# Patient Record
Sex: Female | Born: 1955 | Hispanic: Yes | Marital: Married
Health system: Southern US, Community
[De-identification: ages and names within clinical notes are randomized; demographics above are authoritative.]

---

## 2012-01-07 ENCOUNTER — Inpatient Hospital Stay: Payer: Self-pay | Admitting: Internal Medicine

## 2012-01-07 LAB — HEPATIC FUNCTION PANEL A (ARMC)
Albumin: 3.1 g/dL — ABNORMAL LOW (ref 3.4–5.0)
Alkaline Phosphatase: 240 U/L — ABNORMAL HIGH (ref 50–136)
Bilirubin, Direct: 0.2 mg/dL (ref 0.00–0.20)
SGOT(AST): 46 U/L — ABNORMAL HIGH (ref 15–37)
SGPT (ALT): 63 U/L
Total Protein: 7.4 g/dL (ref 6.4–8.2)

## 2012-01-07 LAB — BASIC METABOLIC PANEL
BUN: 52 mg/dL — ABNORMAL HIGH (ref 7–18)
Calcium, Total: 8.2 mg/dL — ABNORMAL LOW (ref 8.5–10.1)
Chloride: 98 mmol/L (ref 98–107)
Co2: 28 mmol/L (ref 21–32)
Creatinine: 7.95 mg/dL — ABNORMAL HIGH (ref 0.60–1.30)
EGFR (African American): 6 — ABNORMAL LOW
Glucose: 193 mg/dL — ABNORMAL HIGH (ref 65–99)
Potassium: 3.9 mmol/L (ref 3.5–5.1)

## 2012-01-07 LAB — CBC WITH DIFFERENTIAL/PLATELET
Basophil #: 0 10*3/uL (ref 0.0–0.1)
Eosinophil %: 0.1 %
HCT: 33 % — ABNORMAL LOW (ref 35.0–47.0)
Lymphocyte %: 7 %
MCV: 107 fL — ABNORMAL HIGH (ref 80–100)
Monocyte #: 1 x10 3/mm — ABNORMAL HIGH (ref 0.2–0.9)
Neutrophil #: 8.8 10*3/uL — ABNORMAL HIGH (ref 1.4–6.5)
Neutrophil %: 83.4 %
WBC: 10.5 10*3/uL (ref 3.6–11.0)

## 2012-01-07 LAB — CK TOTAL AND CKMB (NOT AT ARMC)
CK-MB: <0.5 ng/mL — ABNORMAL LOW (ref 0.5–3.6)
CK-MB: <0.5 ng/mL — ABNORMAL LOW (ref 0.5–3.6)

## 2012-01-07 LAB — TROPONIN I
Troponin-I: <0.02 ng/mL
Troponin-I: <0.02 ng/mL

## 2012-01-08 LAB — URINALYSIS, COMPLETE
Bilirubin,UR: NEGATIVE
Glucose,UR: NEGATIVE mg/dL (ref 0–75)
Protein: >=500
Specific Gravity: 1.053 (ref 1.003–1.030)
Squamous Epithelial: 51

## 2012-01-08 LAB — CBC WITH DIFFERENTIAL/PLATELET
Basophil #: 0 10*3/uL (ref 0.0–0.1)
Eosinophil %: 0.3 %
HGB: 10 g/dL — ABNORMAL LOW (ref 12.0–16.0)
MCV: 107 fL — ABNORMAL HIGH (ref 80–100)
Monocyte #: 1.1 x10 3/mm — ABNORMAL HIGH (ref 0.2–0.9)
Monocyte %: 11.2 %
Platelet: 136 10*3/uL — ABNORMAL LOW (ref 150–440)
RDW: 14.4 % (ref 11.5–14.5)
WBC: 9.6 10*3/uL (ref 3.6–11.0)

## 2012-01-08 LAB — BASIC METABOLIC PANEL
Anion Gap: 14 (ref 7–16)
BUN: 60 mg/dL — ABNORMAL HIGH (ref 7–18)
Calcium, Total: 7.9 mg/dL — ABNORMAL LOW (ref 8.5–10.1)
Chloride: 98 mmol/L (ref 98–107)
Creatinine: 8.92 mg/dL — ABNORMAL HIGH (ref 0.60–1.30)
EGFR (African American): 5 — ABNORMAL LOW
Osmolality: 293 (ref 275–301)
Potassium: 4.1 mmol/L (ref 3.5–5.1)

## 2012-01-08 LAB — LIPID PANEL
HDL Cholesterol: 33 mg/dL — ABNORMAL LOW (ref 40–60)
Ldl Cholesterol, Calc: 7 mg/dL (ref 0–100)
Triglycerides: 196 mg/dL (ref 0–200)

## 2012-01-08 LAB — MAGNESIUM: Magnesium: 3.2 mg/dL — ABNORMAL HIGH

## 2012-01-08 LAB — PHOSPHORUS: Phosphorus: 4 mg/dL (ref 2.5–4.9)

## 2012-01-08 LAB — CK TOTAL AND CKMB (NOT AT ARMC): CK, Total: 195 U/L (ref 21–215)

## 2012-01-08 LAB — HEMOGLOBIN A1C: Hemoglobin A1C: 6.2 % (ref 4.2–6.3)

## 2012-01-08 LAB — TROPONIN I: Troponin-I: <0.02 ng/mL

## 2012-01-09 LAB — BASIC METABOLIC PANEL
Anion Gap: 9 (ref 7–16)
BUN: 25 mg/dL — ABNORMAL HIGH (ref 7–18)
Calcium, Total: 8.3 mg/dL — ABNORMAL LOW (ref 8.5–10.1)
Chloride: 100 mmol/L (ref 98–107)
Co2: 31 mmol/L (ref 21–32)
EGFR (African American): 11 — ABNORMAL LOW
Glucose: 182 mg/dL — ABNORMAL HIGH (ref 65–99)
Sodium: 140 mmol/L (ref 136–145)

## 2012-01-09 LAB — CBC WITH DIFFERENTIAL/PLATELET
Basophil #: 0 10*3/uL (ref 0.0–0.1)
Basophil %: 0.3 %
Eosinophil #: 0.1 10*3/uL (ref 0.0–0.7)
Eosinophil %: 0.9 %
Lymphocyte %: 12.3 %
MCH: 36.2 pg — ABNORMAL HIGH (ref 26.0–34.0)
Monocyte #: 0.8 x10 3/mm (ref 0.2–0.9)
Monocyte %: 11.9 %
Neutrophil #: 5.2 10*3/uL (ref 1.4–6.5)
Platelet: 103 10*3/uL — ABNORMAL LOW (ref 150–440)
RBC: 2.56 10*6/uL — ABNORMAL LOW (ref 3.80–5.20)

## 2012-01-09 LAB — PHOSPHORUS: Phosphorus: 2.9 mg/dL (ref 2.5–4.9)

## 2012-01-10 LAB — CBC WITH DIFFERENTIAL/PLATELET
Basophil %: 0.2 %
Eosinophil #: 0.1 10*3/uL (ref 0.0–0.7)
HGB: 9.1 g/dL — ABNORMAL LOW (ref 12.0–16.0)
Lymphocyte #: 0.6 10*3/uL — ABNORMAL LOW (ref 1.0–3.6)
Lymphocyte %: 13.1 %
Monocyte #: 0.5 x10 3/mm (ref 0.2–0.9)
Monocyte %: 12.8 %
RBC: 2.53 10*6/uL — ABNORMAL LOW (ref 3.80–5.20)
RDW: 14.2 % (ref 11.5–14.5)
WBC: 4.3 10*3/uL (ref 3.6–11.0)

## 2012-01-10 LAB — BASIC METABOLIC PANEL
Calcium, Total: 8.2 mg/dL — ABNORMAL LOW (ref 8.5–10.1)
Creatinine: 3.44 mg/dL — ABNORMAL HIGH (ref 0.60–1.30)
EGFR (African American): 16 — ABNORMAL LOW
EGFR (Non-African Amer.): 14 — ABNORMAL LOW

## 2012-01-10 LAB — RETICULOCYTES
Absolute Retic Count: 0.0287 10*6/uL (ref 0.024–0.084)
Reticulocyte: 1.11 % (ref 0.5–1.5)

## 2012-01-10 LAB — IRON AND TIBC: Iron Bind.Cap.(Total): 109 ug/dL — ABNORMAL LOW (ref 250–450)

## 2012-01-10 LAB — CLOSTRIDIUM DIFFICILE BY PCR

## 2012-01-11 LAB — BASIC METABOLIC PANEL
Anion Gap: 10 (ref 7–16)
BUN: 42 mg/dL — ABNORMAL HIGH (ref 7–18)
Calcium, Total: 8 mg/dL — ABNORMAL LOW (ref 8.5–10.1)
Co2: 29 mmol/L (ref 21–32)
EGFR (African American): 10 — ABNORMAL LOW
EGFR (Non-African Amer.): 8 — ABNORMAL LOW
Glucose: 331 mg/dL — ABNORMAL HIGH (ref 65–99)
Osmolality: 310 (ref 275–301)
Potassium: 3.9 mmol/L (ref 3.5–5.1)

## 2012-01-11 LAB — CBC WITH DIFFERENTIAL/PLATELET
Basophil #: 0 10*3/uL (ref 0.0–0.1)
Basophil %: 0.2 %
Eosinophil #: 0.3 10*3/uL (ref 0.0–0.7)
Eosinophil %: 5.2 %
HCT: 29.5 % — ABNORMAL LOW (ref 35.0–47.0)
HGB: 9.7 g/dL — ABNORMAL LOW (ref 12.0–16.0)
Monocyte %: 7.5 %
Platelet: 145 10*3/uL — ABNORMAL LOW (ref 150–440)
RDW: 14.3 % (ref 11.5–14.5)

## 2012-01-11 LAB — PHOSPHORUS: Phosphorus: 2.8 mg/dL (ref 2.5–4.9)

## 2012-01-11 LAB — VANCOMYCIN, TROUGH: Vancomycin, Trough: 16 ug/mL (ref 10–20)

## 2012-01-11 LAB — ALBUMIN: Albumin: 2.4 g/dL — ABNORMAL LOW (ref 3.4–5.0)

## 2012-01-12 LAB — CULTURE, BLOOD (SINGLE)

## 2012-01-14 LAB — CBC WITH DIFFERENTIAL/PLATELET
Basophil #: 0 10*3/uL (ref 0.0–0.1)
Eosinophil #: 0.3 10*3/uL (ref 0.0–0.7)
Eosinophil %: 7.5 %
Lymphocyte %: 21 %
Monocyte #: 0.5 x10 3/mm (ref 0.2–0.9)
Monocyte %: 10.1 %
WBC: 4.5 10*3/uL (ref 3.6–11.0)

## 2014-01-03 LAB — CBC
HCT: 36.5 % (ref 35.0–47.0)
HGB: 12.3 g/dL (ref 12.0–16.0)
MCH: 34.9 pg — ABNORMAL HIGH (ref 26.0–34.0)
MCHC: 33.5 g/dL (ref 32.0–36.0)
MCV: 104 fL — ABNORMAL HIGH (ref 80–100)
Platelet: 190 10*3/uL (ref 150–440)
RBC: 3.51 10*6/uL — ABNORMAL LOW (ref 3.80–5.20)
RDW: 14.1 % (ref 11.5–14.5)
WBC: 10.2 10*3/uL (ref 3.6–11.0)

## 2014-01-03 LAB — COMPREHENSIVE METABOLIC PANEL
ALK PHOS: 315 U/L — AB
Albumin: 3.3 g/dL — ABNORMAL LOW (ref 3.4–5.0)
Anion Gap: 11 (ref 7–16)
BILIRUBIN TOTAL: 0.3 mg/dL (ref 0.2–1.0)
BUN: 45 mg/dL — ABNORMAL HIGH (ref 7–18)
CREATININE: 6.75 mg/dL — AB (ref 0.60–1.30)
Calcium, Total: 8.2 mg/dL — ABNORMAL LOW (ref 8.5–10.1)
Chloride: 97 mmol/L — ABNORMAL LOW (ref 98–107)
Co2: 26 mmol/L (ref 21–32)
EGFR (Non-African Amer.): 6 — ABNORMAL LOW
GFR CALC AF AMER: 7 — AB
GLUCOSE: 199 mg/dL — AB (ref 65–99)
Osmolality: 285 (ref 275–301)
POTASSIUM: 3.8 mmol/L (ref 3.5–5.1)
SGOT(AST): 38 U/L — ABNORMAL HIGH (ref 15–37)
SGPT (ALT): 40 U/L (ref 12–78)
SODIUM: 134 mmol/L — AB (ref 136–145)
TOTAL PROTEIN: 8 g/dL (ref 6.4–8.2)

## 2014-01-03 LAB — TROPONIN I: Troponin-I: <0.02 ng/mL

## 2014-01-03 LAB — CK TOTAL AND CKMB (NOT AT ARMC)
CK, TOTAL: 79 U/L
CK-MB: 1.1 ng/mL (ref 0.5–3.6)

## 2014-01-03 LAB — PRO B NATRIURETIC PEPTIDE: B-TYPE NATIURETIC PEPTID: 1987 pg/mL — AB (ref 0–125)

## 2014-01-03 LAB — D-DIMER(ARMC): D-Dimer: 1194 ng/ml

## 2014-01-03 LAB — APTT: ACTIVATED PTT: 33.8 s (ref 23.6–35.9)

## 2014-01-03 LAB — PROTIME-INR
INR: 1.1
Prothrombin Time: 13.7 secs (ref 11.5–14.7)

## 2014-01-04 ENCOUNTER — Inpatient Hospital Stay: Payer: Self-pay | Admitting: Internal Medicine

## 2014-01-04 LAB — URINALYSIS, COMPLETE
Bilirubin,UR: NEGATIVE
Glucose,UR: NEGATIVE mg/dL (ref 0–75)
Ketone: NEGATIVE
NITRITE: NEGATIVE
PH: 8 (ref 4.5–8.0)
Protein: 150
Specific Gravity: 1.005 (ref 1.003–1.030)
Squamous Epithelial: 206

## 2014-01-04 LAB — HCG, QUANTITATIVE, PREGNANCY: Beta Hcg, Quant.: 3 m[IU]/mL

## 2014-01-04 LAB — PHOSPHORUS: Phosphorus: 6.1 mg/dL — ABNORMAL HIGH (ref 2.5–4.9)

## 2014-01-04 LAB — TROPONIN I

## 2014-01-05 LAB — CBC WITH DIFFERENTIAL/PLATELET
Basophil #: 0 10*3/uL (ref 0.0–0.1)
Basophil %: 0.1 %
EOS ABS: 0.1 10*3/uL (ref 0.0–0.7)
EOS PCT: 1.4 %
HCT: 30.3 % — ABNORMAL LOW (ref 35.0–47.0)
HGB: 10.2 g/dL — ABNORMAL LOW (ref 12.0–16.0)
LYMPHS ABS: 0.5 10*3/uL — AB (ref 1.0–3.6)
LYMPHS PCT: 7.3 %
MCH: 35 pg — ABNORMAL HIGH (ref 26.0–34.0)
MCHC: 33.7 g/dL (ref 32.0–36.0)
MCV: 104 fL — ABNORMAL HIGH (ref 80–100)
MONOS PCT: 7 %
Monocyte #: 0.5 x10 3/mm (ref 0.2–0.9)
NEUTROS PCT: 84.2 %
Neutrophil #: 5.7 10*3/uL (ref 1.4–6.5)
Platelet: 152 10*3/uL (ref 150–440)
RBC: 2.92 10*6/uL — ABNORMAL LOW (ref 3.80–5.20)
RDW: 14.2 % (ref 11.5–14.5)
WBC: 6.8 10*3/uL (ref 3.6–11.0)

## 2014-01-05 LAB — BASIC METABOLIC PANEL
ANION GAP: 6 — AB (ref 7–16)
BUN: 23 mg/dL — ABNORMAL HIGH (ref 7–18)
CALCIUM: 7.9 mg/dL — AB (ref 8.5–10.1)
CREATININE: 4.7 mg/dL — AB (ref 0.60–1.30)
Chloride: 95 mmol/L — ABNORMAL LOW (ref 98–107)
Co2: 34 mmol/L — ABNORMAL HIGH (ref 21–32)
EGFR (African American): 11 — ABNORMAL LOW
EGFR (Non-African Amer.): 10 — ABNORMAL LOW
GLUCOSE: 137 mg/dL — AB (ref 65–99)
Osmolality: 276 (ref 275–301)
Potassium: 3.3 mmol/L — ABNORMAL LOW (ref 3.5–5.1)
SODIUM: 135 mmol/L — AB (ref 136–145)

## 2014-01-06 LAB — PHOSPHORUS: PHOSPHORUS: 4.5 mg/dL (ref 2.5–4.9)

## 2014-01-06 LAB — URINE CULTURE

## 2014-01-07 LAB — PHENYTOIN LEVEL, TOTAL: Dilantin: 12.3 ug/mL (ref 10.0–20.0)

## 2014-01-08 LAB — URINE CULTURE

## 2014-01-09 LAB — CULTURE, BLOOD (SINGLE)

## 2014-12-31 NOTE — H&P (Signed)
PATIENT NAME:  Terri Robinson, Terri Robinson MR#:  [id number] DATE OF BIRTH:  04-20-1956  DATE OF ADMISSION:  01/03/2014  PRIMARY CARE PHYSICIAN: Located in [place].   CHIEF COMPLAINT: Shortness of breath and chest pain.   HISTORY OF PRESENT ILLNESS: This is a 59 year old female who presents to the hospital due to shortness of breath and chest pain that began a few days back. The patient is visiting from [place]. She is visiting her family in this area. She has had a cough for now for a few days.  Her cough has been nonproductive. This morning, her husband noticed that she has been wheezing a little bit. She then started complaining of shortness of breath and also some substernal chest pain. She was, therefore, sent over to the ER for further evaluation. Her chest pain has now resolved. She is not hypoxic as her oxygen saturations are stable on room air. She was noted to have an elevated D-dimer. Her Dopplers are negative for DVT, but we cannot do a CT scan of the chest to rule out PE as the patient is a dialysis patient and she has poor IV access. She is being admitted to the hospital for observation for chest pain and workup for underlying possible pulmonary embolism. She denies any nausea, vomiting, abdominal pain, fevers, chills, or any other upper respiratory symptoms or any other associated symptoms presently.   REVIEW OF SYSTEMS:  CONSTITUTIONAL: No documented fever. No weight gain, no weight loss.  EYES: No blurred or double vision.  EARS, NOSE, AND THROAT:  No tinnitus or postnasal drip. No redness of the oropharynx.  RESPIRATORY: No cough, no wheeze, no hemoptysis. Positive dyspnea.  CARDIOVASCULAR: Positive chest pain. No orthopnea, no palpitations, no syncope.  GASTROINTESTINAL: No nausea, no vomiting, and diarrhea. No abdominal pain. No melena or hematochezia.  GENITOURINARY: No dysuria or hematuria.  ENDOCRINE: No polyuria or nocturia, heat or cold intolerance.  HEMATOLOGIC: No anemia, no  bruising, no bleeding.  INTEGUMENTARY: No rashes, no lesions.  MUSCULOSKELETAL: No arthritis. No swelling. No gout.  NEUROLOGIC: No numbness or tingling. No ataxia. No seizure activity.  PSYCHIATRIC: No anxiety, no insomnia, no ADD.  PAST MEDICAL HISTORY: Consistent with diabetes; diabetic neuropathy; history of previous CVA with right-sided hemiparesis; end-stage renal disease on hemodialysis Tuesdays, Thursdays, Saturdays; history of seizures; hypothyroidism; gastroesophageal reflux disease; depression; glaucoma.   ALLERGIES: No known drug allergies.   SOCIAL HISTORY: Used to be a smoker. Does have a 5-6 pack-year smoking history. No alcohol abuse. No illicit drug abuse. Lives at home with her husband.   FAMILY HISTORY: Both mother and father are deceased. Both mother and father were diabetic. Father died from complications of diabetes, so did her mother.   CURRENT MEDICATIONS: As follows: Tylenol with hydrocodone 10/325 one 1 tablet q. 6 hours as needed, aspirin 81 mg daily, Colace 100 mg b.i.d., dorzolamide/timolol eye drops 1 drop b.i.d., gabapentin 300 mg at bedtime, glipizide 5 mg daily, Humulin N 15 units b.i.d., Humulin regular as needed t.i.d. with meals, lactulose 5 mL b.i.d., Synthroid 75 mcg daily, Megace 10 mL daily, omeprazole 20 mg daily, Dilantin 100 mg at lunchtime on dialysis days, Dilantin 200 mg b.i.d., prednisolone 1% ophthalmic solution b.i.d., Seroquel 100 mg at bedtime, Renvela 0.8 grams oral powder t.i.d. with meals one packet with snacks, Zoloft 50 mg daily, Ambien 5 mg at bedtime.   PHYSICAL EXAMINATION: Presently is as follows:  VITAL SIGNS: Noted to be, temperature 98.5, pulse 62, respirations 20, blood pressure 167/75, saturation 92%  on room air.  GENERAL: She is a lethargic-appearing female in bed but in no apparent distress.  HEAD, EYES, EARS, NOSE, THROAT: She is atraumatic, normocephalic. Her extraocular muscles are intact. She is blind in the left eye. Pupils  are equal and reactive to light on the right.  NECK: Supple. There is no jugular venous distention. No bruits, no lymphadenopathy, no thyromegaly.  HEART: Regular rate and rhythm. No murmurs, no rubs, no clicks.  LUNGS: Clear to auscultation anteriorly. No rales. No rhonchi. No wheezes. Some upper airway rhonchi. Negative use of accessory muscles.  ABDOMEN: Soft, flat, nontender, nondistended. Has good bowel sounds. No hepatosplenomegaly appreciated. Positive suprapubic catheter.  EXTREMITIES: No evidence of any cyanosis, clubbing, or peripheral edema. Has +2 pedal and radial pulses bilaterally. She has a left upper extremity AV fistula with no drainage or no swelling, but positive bruit and good thrill.   NEUROLOGIC: She is alert, awake, and oriented x 3. She has dense right-sided hemiparesis due to previous stroke.  SKIN: Moist and warm with no rashes.  LYMPHATIC: There is no cervical or axillary lymphadenopathy.   LABORATORY DATA: Showed a serum glucose of 199, BUN 45, creatinine 6.75, sodium 134, potassium 3.8, chloride 97, bicarbonate 26. LFTs are within normal limits. Troponin less than 0.02. White cell count 10.2, hemoglobin 12.3, hematocrit 36.5, platelet count 190,000. INR is 1.1.   RADIOLOGICAL DATA: The patient did have a chest x-ray done which showed no evidence of any acute disease. The patient also had an ultrasound of the lower extremities done which showed no evidence of an acute DVT.   ASSESSMENT AND PLAN: This is a 59 year old female with history of end-stage renal disease on hemodialysis, history of seizures, previous cerebrovascular accident with right-sided hemiparesis, hypothyroidism, gastroesophageal reflux disease, depression, glaucoma, diabetes, secondary hyperparathyroidism, diabetic neuropathy, presents to the hospital due to shortness of breath and chest pain.  1. Chest pain/shortness of breath. The exact etiology of this is unclear, most likely related to acute  bronchitis. The patient does have an elevated D-dimer and has had recent travel history; therefore, we need to rule out a pulmonary embolism, but she is already bedbound due to her previous cerebrovascular accident.  Her Dopplers are negative for any deep venous thrombosis.  She cannot get a CT scan as we cannot get adequate intravenous access since she is a dialysis patient; therefore, I will get a VQ scan in the morning. I will observe her on off unit telemetry, follow serial cardiac markers, unlikely this is cardiac in nature. Hold off on anticoagulation until we get her VQ scan results. She is not clinically truly hypoxic.  2. End-stage renal disease on hemodialysis. I will get a nephrology consult. The patient gets dialysis on Tuesday, Thursday, Saturday.  3. History of seizures. I will continue her Dilantin. No acute seizure-type activity.  4. Hypothyroidism. Continue Synthroid.  5. Gastroesophageal reflux disease. Continue Protonix.  6. Depression. Continue Zoloft and Seroquel.  7. Diabetes. Continue with glipizide, sliding scale insulin, and Levemir for now. 8. Secondary hyperparathyroidism. Continue Renvela.  9. Diabetic neuropathy. Continue Neurontin.   CODE STATUS: The patient is a full code.   TIME SPENT ON ADMISSION: 50 minutes.    ____________________________ Rolly Pancake. Cherlynn Kaiser, MD vjs:dd D: 01/03/2014 18:11:57 ET T: 01/03/2014 18:40:25 ET JOB#: [id number]  cc: Rolly Pancake. Cherlynn Kaiser, MD, <Dictator> Houston Siren MD ELECTRONICALLY SIGNED 01/19/2014 14:03

## 2014-12-31 NOTE — Discharge Summary (Signed)
PATIENT NAME:  Terri Robinson, Aneita MR#:  [id number] DATE OF BIRTH:  1956-03-30  DATE OF ADMISSION:  01/04/2014 DATE OF DISCHARGE:  01/07/2014   ADMISSION DIAGNOSIS: Acute bronchitis.   DISCHARGE DIAGNOSES:  1. Sepsis, likely secondary to suprapubic catheter infection.  2. Shortness of breath.  3. End-stage renal disease, on hemodialysis. 4. Seizure history. 5. Diabetes.  6. Hypothyroidism.   CONSULTATIONS:  1. Stann Mainlandavid P. Sampson GoonFitzgerald, MD 2. Munsoor Lizabeth LeydenN. Lateef, MD  LABORATORY DATA AT DISCHARGE: Dilantin level is 12.3.  Blood cultures negative to date.  Urine culture was too many small colonies.  White blood cells 6.8, hemoglobin 10.2, hematocrit 31, platelets are 152.  Sodium 135, potassium 3.3, chloride 95, bicarbonate 34, BUN 23, creatinine 4.70, glucose of 137.  HOSPITAL COURSE: A 59 year old female who is visiting [place], was on her way to take a flight, who presented initially with shortness of breath and found to have acute bronchitis as an admission diagnosis, and subsequently during hospitalization, found to have sepsis. For further details, please refer to the H and P.   1. Sepsis, likely from suprapubic catheterization. The patient initially presented with shortness of breath and acute bronchitis. The patient was found to have hypotension and a fever. Blood culture was ordered. Urine culture was ordered. ID was consulted. She was placed on broad-spectrum antibiotics. Her suprapubic catheter did look infected. This was changed by interventional radiology. Her hypotension improved. She is afebrile. Blood cultures were negative to date. ID consultation recommended empiric Levaquin treatment for a total of 14 days.  2. Shortness of breath, which was her initial presentation, with acute bronchitis. The patient's CT scan was negative. Dopplers were negative.  3. End-stage renal disease, on hemodialysis. The patient did receive dialysis here and will be arranged for dialysis tomorrow as an  outpatient.  4. Seizure disorder. The patient's Dilantin level was within normal limits. She will continue her outpatient medications. 5. Diabetes. The patient will continue outpatient medications.  6. Hypothyroidism. The patient was continued on her Synthroid.   DISCHARGE MEDICATIONS: 1. Docusate 100 mg b.i.d. p.r.n.  2. Omeprazole 20 mg daily.  3. Prednisolone 1 drop b.i.d.  4. Ambien 5 mg at bedtime.  5. Aspirin 81 mg daily.  6. Dorzolamide/timolol 1 drop b.i.d.  7. Humulin R injectable 3 times a day as needed per sliding scale.  8. Lactulose 5 mL b.i.d.  9. Acetaminophen/hydrocodone 325/10 q.6 hours p.r.n. pain.  10. Phenytoin 100 mg on dialysis days.  11. Gabapentin 300 mg at bedtime.  12. Zoloft 50 mg daily. 13. Glipizide 5 mg daily.  14. Humulin N 15 units b.i.d.  15. Quetiapine 100 mg at bedtime.  16. Renvela 1 packet t.i.d. and 1 with snacks.  17. Levothyroxine 75 mcg daily.  18. Phenytoin 100 mg 2 tablets b.i.d.  19. Megace 40 mg daily.  20. Levaquin 250 mg q.48 hours for 10 days.   DISCHARGE DIET: Low sodium, ADA diet.   DISCHARGE ACTIVITY: As tolerated.   DISCHARGE FOLLOWUP: The patient will follow up with her primary doctor when she is back in [place].   TIME SPENT: 35 minutes.   CONDITION: The patient was medically stable for discharge.   ____________________________ Travius Crochet P. Juliene PinaMody, MD spm:lb D: 01/07/2014 12:36:40 ET T: 01/07/2014 13:20:57 ET JOB#: [id number]  cc: Geral Coker P. Juliene PinaMody, MD, <Dictator> Janyth ContesSITAL P Brok Stocking MD ELECTRONICALLY SIGNED 01/07/2014 17:35

## 2014-12-31 NOTE — Consult Note (Signed)
PATIENT NAME:  Terri Robinson, MATHENA MR#:  [id number] DATE OF BIRTH:  10-24-1955  DATE OF CONSULTATION:  01/05/2014  REFERRING PHYSICIAN: Adrian Saran, MD  CONSULTING PHYSICIAN:  Stann Mainland. Sampson Goon, MD  REASON FOR CONSULTATION: Fever.   HISTORY OF PRESENT ILLNESS: This is a pleasant 59 year old female with a history of end-stage renal disease as well as prior CVA, is largely bedbound and cared for by her husband. She was visiting her family here in [place] from [place]. She developed increasing shortness of breath and cough for a few days and was admitted on April 27. At that time, she was being assessed for a PE and there was no evidence of infection initially; however, during her hospitalization, she developed a high fever. She also had a urinalysis with many white cells, but she has a suprapubic catheter. There was some report of drainage around the catheter site. The catheter was changed and she was seen by Dr. Lonna Cobb yesterday.   Currently, the patient and her husband report she is improving. She does still have a thick cough with sputum. Her respiratory status has been stable. She has been tolerating dialysis and is hemodynamically stable. VQ scan done yesterday afternoon was negative.   PAST MEDICAL HISTORY:  1.  End-stage renal disease.  2.  CVA with hemiparesis.  3.  Prior seizure disorder.  4.  Diabetes.  5.  Diabetic neuropathy.  6.  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  7.  Hypothyroidism.  8.  GERD.  9.  Depression.  10. Glaucoma.  11. Seizures.   PAST SURGICAL HISTORY:  1.  Placement of suprapubic catheter in 2012.  2.  Placement of dialysis fistula.   ALLERGIES: No known drug allergies.   SOCIAL HISTORY: Prior light smoker. No alcohol or drugs. Lives at home with her husband who cares for her 24/7.   FAMILY HISTORY: Positive for diabetes.  REVIEW OF SYSTEMS: Eleven systems reviewed and negative except per HPI.   ANTIBIOTICS SINCE ADMISSION: Include levofloxacin begun April 26,  Zosyn begun 04/27 and vancomycin begun 04/27.   PHYSICAL EXAMINATION:  VITAL SIGNS: Temperature current 98.7, pulse 84, blood pressure 94/50, respirations 16, sat 96% on 0.5 L.  VITAL SIGNS: T-max over the last 24 hours is 101.1, prior to that T-max was 100.5 on the 28th. On admission, her temperature was 98.1.  GENERAL: She is chronically ill-appearing, but is awake and able to answer yes or no questions. Her eyes are kept closed at baseline. She is able to open them minimally.  HEENT: Oropharynx is clear. She is edentulous. She has a lip smacking motion.  NECK: Supple. She has a right IJ line in place.  HEART: Regular.  LUNGS: Have some rhonchi bilaterally.  ABDOMEN: Soft, nontender, nondistended. She has a suprapubic catheter with some erythema around it, but otherwise intact.  EXTREMITIES: No clubbing, cyanosis or edema.  SKIN: No bed sores. AV fistula in her left upper arm that is intact with no tenderness or swelling. NEURO: She is awake and alert and able to interact, but has right hemiparesis from prior CVA.   DIAGNOSTIC DATA: Chest x-ray done 04/27, no active disease. VQ scan 04/28 was negative for PE. Ultrasound lower extremity negative for blood clots. White blood count on admission was 10.2, currently it is 6.8, hemoglobin 10.2, platelets 152. Blood cultures 04/28 negative x 2. Urinalysis had 486 white cells. No culture appears to be sent.   IMPRESSION: A 59 year old hemiparetic with prior cerebrovascular accident as well as end-stage renal disease, admitted  with cough and shortness of breath after traveling from [place]. She has a urinalysis with many white cells, although this is from her suprapubic catheter with very minimal output, so it is difficult to tell if she truly has an infection there. She also has a cough with sputum production. Chest x-ray initially was negative.   RECOMMENDATIONS:  1.  Check sputum culture to evaluate for any resistant organisms.  2.  Send urine  culture to evaluate for any resistant organisms.  3.  Continue current antibiotics, but likely tomorrow can transition back to just levofloxacin if cultures are unrevealing.  4.  Continue to monitor for further fevers.  ____________________________ Stann Mainlandavid P. Sampson GoonFitzgerald, MD dpf:aw D: 01/05/2014 08:41:16 ET T: 01/05/2014 09:04:37 ET JOB#: [id number]  cc: Stann Mainlandavid P. Sampson GoonFitzgerald, MD, <Dictator> Chancellor Vanderloop Sampson GoonFITZGERALD MD ELECTRONICALLY SIGNED 01/09/2014 21:10

## 2014-12-31 NOTE — Consult Note (Signed)
Requesting physician: Dr. Juliene PinaMody  Refill for consultation: Infected suprapubic tube  History of present illness: [age] year old female who was admitted on 4/27 with chest pain, cough and shortness of breath.  She is visiting the area from [place] and has an indwelling suprapubic tube.  She is a low-grade fever and was noted to have purulent drainage from the suprapubic tube site. History was obtained from her husband.  She has had a prior CVA and he states the suprapubic tube was placed in late 2012 for recurrent urinary tract infections and incomplete bladder emptying.  [place] [facility].  She has end-stage renal disease and is on hemodialysis.  Her husband states her urinary output varies between 10-50 cc daily.  Past medical history: End-stage renal disease, diabetes mellitus, diabetic neuropathy, history of CVA, seizure disorder.  Review of systems: Otherwise noncontributory except as per the history of present illness  Exam: Temperature 98.9 BP 111/63  pulse 92  General: Alert female in no acute distress  Abdomen: soft with mild suprapubic tenderness.  The suprapubic tube had been changed and no purulence was noted.  There is no surrounding skin erythema or fluctuance.  Impression: There is no evidence of cellulitis, abscess.  She will have chronic bacteriuria with the indwelling suprapubic tube.  It is unclear why she has a suprapubic tube drainage when she is severely oliguric.  Recommendation: The suprapubic catheter has been changed.  No further recommendations.  Follow-up with her regular urologist when she returns to [place].   Electronic Signatures: Riki AltesStoioff, Aime Carreras C (MD)  (Signed on 28-Apr-15 18:08)  Authored  Last Updated: 28-Apr-15 18:08 by Riki AltesStoioff, Samayah Novinger C (MD)

## 2015-01-01 NOTE — Consult Note (Signed)
PATIENT NAME:  Thersa SaltDURAN, Janiyah MR#:  [id number] DATE OF BIRTH:  06-Aug-1956  DATE OF CONSULTATION:  01/08/2012  REFERRING PHYSICIAN:  Dr. Allena KatzPatel CONSULTING PHYSICIAN:  Rosalyn GessMichael E. Bonnie Overdorf, MD  REASON FOR CONSULTATION: Fever and mental status changes.   HISTORY OF PRESENT ILLNESS: The patient is a 59 year old white female with a past history significant for end-stage renal disease on hemodialysis dialysis, diabetes, and a prior stroke with residual right-sided weakness who was admitted yesterday with one to two day history of fevers, malaise, decreased appetite, and mental status changes. She is visiting her daughter from [place] and had been fairly well. For several days prior to admission, her husband noted first that she was eating less and had been feeling warm to touch. He did not actually take her temperature. Subsequently, she did have some chills and sweats. She had been having a little bit of constipation and he had been giving her some medicine for that and she then subsequently had loose stool. He is concerned that some of the stool may have gotten in contact with her suprapubic catheter. The catheter has been in place for several weeks and was due to be changed last week, but they were told by their physician in [place] that it would be okay to go on the vacation and change it when she returned. She has not had significant cough or sputum production, but he did notice that she was having some gurgling when she was breathing. She is an extremely difficult historian and the history was obtained exclusively from the chart and from her husband. When she came to the emergency room, her temperature was elevated and a chest x-ray showed possible infiltrate. A urinalysis showed pyuria. She had been receiving dialysis while on vacation and had had no trouble during dialysis. Yesterday was her usual dialysis day which she has missed.   ALLERGIES: No known drug allergies.   PAST MEDICAL HISTORY:   1. Diabetes.  2. Stroke with right-sided weakness.  3. End-stage renal disease, on hemodialysis.  4. Seizure disorder.  5. 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  6. Status post left arm fistula placement.   SOCIAL HISTORY: The patient lives with her husband in [place]. She does not smoke. Occasional alcohol intake.   FAMILY HISTORY: Positive for diabetes and coronary artery disease.   REVIEW OF SYSTEMS: CONSTITUTIONAL: Unable to obtain from the patient due to her stroke, but per her husband she had been having some fevers, chills, sweats, and malaise. HEENT: No significant congestion or headaches. NECK: No swollen glands. No stiffness. RESPIRATORY: No significant change in her baseline cough. No shortness of breath, but she was having some rattling in her breathing. CARDIAC: No chest pain or palpitations. GASTROINTESTINAL: No nausea and no vomiting. Positive constipation and subsequent diarrhea with a medication. Decreased appetite over the last several days. GENITOURINARY: Her husband has noted increased foul-smelling urine coming of the catheter. MUSCULOSKELETAL: She has right-sided weakness related to her prior stroke. NEUROLOGIC: The patient has been more confused and less interactive. Her husband states that since being in the hospital she is about 90 to 95% better. SKIN: No rashes. All other systems are negative, per the husband.   PHYSICAL EXAMINATION:   VITAL SIGNS: T-max 102.2, T-current 102.2, pulse 92, blood pressure 101/55, and 95% on 2 liters.   GENERAL: A 59 year old female in no acute distress.   HEENT: Normocephalic, atraumatic. Pupils appeared to be equal and reactive to light. Extraocular motion intact.  Sclerae, conjunctivae, and lids are without evidence for emboli or petechiae. Oropharynx difficult to examine, but no apparent tongue abnormalities. Lips and gums were in good condition.   NECK: Midline trachea. No lymphadenopathy. No  thyromegaly.   LUNGS: Clear to auscultation bilaterally with good air movement. No focal consolidation.   HEART: Regular rate and rhythm without murmur, rub, or gallop.   ABDOMEN: Soft, nontender, and nondistended. No hepatosplenomegaly. No hernia is noted. There is a suprapubic catheter in place that was minimally tender to palpation.   EXTREMITIES: No evidence for tenosynovitis. Her left hand had scarring consistent with her prior gangrene and surgical excision. There was a fistula in place in the left upper arm that had a positive thrill. There was no erythema or tenderness around the site.   NEUROLOGIC: The patient had residual deficits related to her prior stroke. She had slurred speech and left-sided weakness.   PSYCHIATRIC: Difficult to assess due to her prior stroke, but appeared to be normal.  LABS/STUDIES: BUN 60, creatinine 8.92, bicarbonate 26, anion gap 14, AST 46 ALT 63, an alkaline phosphatase 240. TSH was 0.973. White count 9.6, hemoglobin 10.0, platelet count 136, and ANC 7.3. White count on admission was 10.5 with an ANC of 8.8.   Blood cultures from admission are pending.   A urinalysis had 2+ blood, greater than 500 protein, negative nitrites, 2+ leukocyte esterase, 205 red cells, 325 white cells, and 51 epithelial cells.   Urine culture is currently pending.   A chest x-ray showed a possible infiltrate in the right base medially.  A head CT showed a large old left anterior cerebral artery distribution infarct.   A CT scan of the abdomen and pelvis without contrast demonstrated the suprapubic catheter to be in satisfactory position. There was bibasilar airspace disease concerning for pneumonia versus atelectasis and anasarca was present.   IMPRESSION: This is a 59 year old white female with a history of end-stage renal disease on hemodialysis, stroke, and diabetes who is admitted with fever and mental status changes.   RECOMMENDATIONS:  1. Possible sources include  the urine, the lungs, and the blood stream via hemodialysis. Her urine is foul smelling and has pyuria. Blood cultures are pending. Her chest x-ray and lung cuts of the abdomen on the CT show possible infiltrate but they were not very convincing. With the stroke she would be at risk for aspiration.  Being on hemodialysis puts her at risk for bacteremia; however, the fistula does not appear to be acutely infected.  2. I agree with broad-spectrum antibiotics. We will continue vancomycin, levofloxacin, and ertapenem.  3. We will await the urine and blood cultures.  4. If she continues to spike and the cultures remain negative, would consider getting venous Dopplers to look for deep vein thrombosis.   This is a moderately complex infectious disease case. Thank you very much for involving me in Ms. Ceasar's care.  ____________________________ Rosalyn Gess. Amandine Covino, MD meb:slb D: 01/08/2012 15:29:09 ET T: 01/08/2012 16:24:26 ET JOB#: [id number]  cc: Rosalyn Gess. Lameshia Hypolite, MD, <Dictator> Angla Delahunt E Lillyahna Hemberger MD ELECTRONICALLY SIGNED 01/09/2012 8:00

## 2015-01-01 NOTE — Discharge Summary (Signed)
PATIENT NAME:  Terri Robinson, Terri Robinson MR#:  [id number] DATE OF BIRTH:  [birth year]  DATE OF ADMISSION:  01/07/2012 DATE OF DISCHARGE:  01/14/2012  DISCHARGE DISPOSITION: Home.   DISCHARGE DIAGNOSES:  1. Fever secondary to pneumonia and also urinary tract infection.  2. Metabolic encephalopathy secondary to fever, resolved. 3. Hypothyroidism.  4. Hypertension.  5. Diabetes mellitus, type II.  6. History of cerebrovascular accident.  7. History of seizure disorder.  8. End-stage renal disease, on hemodialysis.  9. Anemia of chronic kidney disease.  10. History of cerebrovascular accident with right-sided deficit and is wheelchair bound.  DISCHARGE MEDICATIONS:  1. Colace 100 mg p.o. twice a day as needed. 2. Neurontin 300 mg at night. 3. Glipizide 5 mg daily. 4. Regular insulin according to sliding scale.  5. Hydrocodone with acetaminophen 10/325 mg every 8 hours as needed for pain.  6. Levothyroxine 75 mcg p.o. daily.  7. Megace 40 mg/mL 10 mL once a day. 8. Omeprazole 20 mg one capsule daily. 9. Dilantin 200 mg in the morning and 200 mg at night. 10. Prednisolone eyedrops to right eye two times a day. 11. Pro Air two puffs twice a day. 12. Seroquel 100 mg daily. 13. Renvela 800 mg three times daily. 14. Zoloft 50 mg daily. 15. Ambien 5 mg daily. 16. Aspirin 81 mg daily. 17. Atrovent 2 puffs four times daily. 18. Dorzolamide and timolol eyedrops one drop in both eyes twice a day.   CONSULTANTS: 1. Munsoor Cherylann Ratel, MD - Nephrology. 2. Orson Aloe, MD - Infectious Disease.  3. Anola Gurney, MD - Urology.  HOSPITAL COURSE:  1. The patient is a 59 year old female who is visiting family from [place] who came in because the patient was found to be lethargic and had fever of 101.8. The patient is visiting her daughters and grand-kids along with her husband. Please look in the History and Physical done by Dr. Enid Baas. The patient was lethargic and having cough and had a fever  of 101.8. When she came in, her blood pressure was 97/70. Chest x-ray showed right hilar infiltrate. She also had a suprapubic catheter which was noted to have some pus around that. She was admitted to the hospitalist service for septic shock. The patient was started on IV vancomycin and Levaquin. Infectious Disease consult with Dr. Leavy Cella was obtained. The patient had a lot of cough. CT of the abdomen was done and also chest x-ray. Chest x-ray showed possible right base infiltrate. Also abdominal CT was done regarding suprapubic catheter position. CT of the abdomen showed SPC is in satisfactory position and bibasilar airspace disease concerning for pneumonia. The patient was continued on vancomycin, Levaquin, and ertapenem. Her blood cultures and urine cultures have been negative. The patient had an episode of fever to 101.1 on 01/07/2012 and then her fever went up to 102.2 on 01/08/2012. After that she remained afebrile and hemodynamically stable. The patient's cough has improved. She finished seven days of antibiotics were Levaquin, vancomycin, and ertapenem. Because her cultures have been negative, Dr. Leavy Cella recommended no further antibiotics. The patient's white count has been within normal limits. On 01/11/2012 it went up to 5 and then her neutrophils were normal at 3.8. Repeat chest x-ray done on 01/11/2012 showed only congestive heart failure signs. The patient is doing better.  2. SPC and also foul-smelling urine. Urinalysis done on admission showed leukocyte esterase 2+, turbid colored, and also bacteria 3+, and WBC 325. Urine cultures have been negative. SPC has been changed by Dr.  Evelene CroonWolff. The patient is almost like making very less urine, like 20 mL in 24 hours. He [facility] she goes back to [place] because of minimal output and the same is explained to the patient's husband. He is aware of that. He said he will follow-up [facility] in [place]. The  patient has no fever and no pain around the [facility] site and remained asymptomatic.  3. End-stage renal disease, on hemodialysis. She got good hemodialysis Tuesday, Thursday, and Saturday, and she got dialysis today. She is tolerating the dialysis sessions here. While she is here, she can resume dialysis and then when she goes to [place] she can follow up with his nephrologist. 4. Diabetes mellitus, type 2. When she came she was lethargic and had metabolic encephalopathy and her diet was initially a pureed diet and then changed to mechanical soft as she takes at home. The patient is alert and oriented. The husband is very helpful giving diet and helping her with meal trays. He usually helps her with the wheelchair and was going around the nurses station in the wheelchair. The patient's glipizide was stopped because of her lethargy we restarted it yesterday and she can continue the home dose of glipizide. Her sugars have been around 170 and 180s so she can resume her insulin as well. She takes Humulin N 15 units in the morning and 5 units in the evening. That can be continued.   LABS/STUDIES: On admission TSH 0.973. WBC on admission 10.5, hemoglobin 11, hematocrit 33, and platelets 124. Troponin less than 0.02.  Blood cultures have been negative for five days.   Electrolytes on admission: Sodium 137, potassium 3.9, chloride 98, bicarbonate 28, BUN 52, creatinine 7.95, and glucose 193.   Chest x-ray, as I mentioned, showed possible infiltrate in the right base.  ABG on admission: pH 7.43, pCO2 43, pO2 49, base excess 3.7, and saturation 85.4%.   Lactic acid 1.9.   CT of the head was done because she was lethargic which showed large old left-sided anterior cerebral artery distribution infarct. No acute abnormality.   The patient's urine cultures have been negative. Urine showed turbid urine with 2+ leukocyte esterase and also WBCs around 325 and bacteria 3+.   CT of the abdomen was done to see if the  suprapubic catheter was infected. It showed the suprapubic catheter is in satisfactory position and bibasilar disease concerning for pneumonia and anasarca.   C. difficile by PCR has been negative.       The patient's condition is stable. I spoke with the husband about the plan of discharge.   TIME SPENT ON DISCHARGE PREPARATION: More than 30 minutes.   ____________________________ Katha HammingSnehalatha Taejah Ohalloran, MD sk:slb D: 01/14/2012 10:54:07 ET T: 01/14/2012 14:28:51 ET JOB#: [id number]  cc: Katha HammingSnehalatha Reyan Helle, MD, <Dictator> Katha HammingSNEHALATHA Chaquetta Schlottman MD ELECTRONICALLY SIGNED 01/18/2012 23:04

## 2015-01-01 NOTE — Consult Note (Signed)
Impression: 59yo F w/ h/o ESRD on HD, CVA, DM admitted with fever and MS changes.  Possible sources include the urine, the lungs or the blood stream via HD.  Her urine is foul smelling and has pyuria.  Cultures are pending.  Her CXR and lung cuts of the abd CT show possible infiltrate, but they were not very convincing.  With the CVA, she would be at risk for aspiration.  Being on HD puts her at risk for bacteremia. Agree with broad spectrum antibiotics.  Continue vanco, levofloxacin and ertapenem. Await urine and blood cultures. If she continues to spike, and the cultures remain negative, would consider getting venous dopplers.  Electronic Signatures: [name], Rosalyn GessMichael E (MD)  (Signed on 01-May-13 15:20)  Authored  Last Updated: 01-May-13 15:20 by Shailen Thielen, Rosalyn GessMichael E (MD)

## 2015-01-01 NOTE — H&P (Signed)
PATIENT NAME:  Terri Robinson, Terri MR#:  [id number] DATE OF BIRTH:  1955/12/21  DATE OF ADMISSION:  01/07/2012  ADMITTING PHYSICIAN: Enid Baasadhika Azaliyah Kennard, MD  PRIMARY CARE PHYSICIAN: one local, located in [place].   CHIEF COMPLAINT: Altered mental status and fever.   HISTORY OF PRESENT ILLNESS: Terri Robinson is a 59 year old unfortunate female with a past medical history significant for insulin-dependent diabetes mellitus, history of CVA resulting in residual right-sided weakness, wheelchair bound status and also slurred speech, and end-stage renal disease on hemodialysis who was brought in by family secondary to the above-mentioned complaints. The patient is originally from [place] and has been visiting family here for the past two weeks. Her husband is the primary caregiver for her. He is the one at bedside and gives most of the history as the patient's mental status is very altered at this time. According to the husband, the patient is baseline wheelchair bound status but has been doing pretty well up until Sunday night, her appetite was good and she was watching movies with the family, but yesterday morning she was slightly sluggish and her appetite was decreased. This morning she was difficult to be aroused. They noticed a cough a couple of days ago and her breathing was irregular yesterday. When she presented here, she had a temperature of 101.8 degrees Fahrenheit, pulse 114, and blood pressure 97/70. Chest x-ray is showing right hilar and possible infiltrate, and urinalysis is pending at this time. The patient has a chronic suprapubic catheter after her stroke. Although she is a dialysis patient, she does make some urine. According to her husband, she has had some diarrhea, loose stools over the weekend, which has stopped now, but while cleaning stool he feels that they could have put some fecal matter into the suprapubic catheter.    PAST MEDICAL HISTORY:  1. Insulin-dependent diabetes mellitus.   2. History of CVA with residual right-sided weakness.  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  4. Glaucoma.  5. End-stage renal disease on hemodialysis Tuesday, Thursday, and Saturday schedule. 6. Seizure disorder starting after her stroke. Last seizure was more than 3 years ago.    PAST SURGICAL HISTORY:  1. Cholecystectomy.  2. Amputation of left hand medial four digits secondary to gangrene in the past.  3. Left arm fistula placement.   SOCIAL HISTORY: She lives at home with her husband and has been married for more than 30 years. No smoking. Occasional alcohol use.   FAMILY HISTORY: Diabetes and heart disease runs in the family.   ALLERGIES: No known drug allergies.  HOME MEDICATIONS:  1. Acetaminophen/hydrocodone 325/10 mg 1 tablet every 6 to 8 hours as needed for pain.  2. Aspirin 81 mg p.o. daily.  3. Atrovent inhaler 3 puffs every 6 hours p.r.n.  4. Colace 100 mg p.o. twice a day. 5. Dorzolamide/timolol ophthalmic drops to both eyes twice a day.  6. Gabapentin 300 mg p.o. at bedtime.  7. Glipizide 5 mg p.o. daily.  8. Humulin N 15 units subcutaneous in the morning and 5 units subcutaneous in the evening.  9. Humulin R sliding scale.  10. Levothyroxine 75 mcg p.o. daily.  11. Megace 10 mL/40 mg daily. 12. Prilosec 20 mg p.o. daily.  13. Phenytoin 200 mg p.o. twice a day.  14. Prednisolone acetate 1% ophthalmic solution one drop to right eye twice a day.  15. ProAir 2 puffs twice a day for asthma.  16. Quetiapine 100 mg p.o. at bedtime.  17. Renvela 800 mg 1 tablet p.o.  three times daily with meals.  18. Sertraline 50 mg p.o. daily.  19. Zolpidem 5 mg p.o. bedtime as needed.   REVIEW OF SYSTEMS: Difficult to be obtained secondary to the patient's mental status.      PHYSICAL EXAMINATION:   VITAL SIGNS: Temperature 101.8 degrees Fahrenheit, pulse 114, respirations 28, blood pressure 97/70, and pulse oximetry 97% on 2 liters oxygen.  GENERAL: Well built, well nourished  female lying in bed very restless and altered.   HEENT: Normocephalic, atraumatic. Her right eye pupil is 2 mm with sluggish reaction to light. Left eye cornea is opacified. Chronic glaucoma and blindness in the left eye. Extraocular movements are intact. Anicteric sclerae. Oropharynx clear without erythema, mass, or exudates.   NECK: Supple. No thyromegaly, JVD, or carotid bruits. No lymphadenopathy.   LUNGS: Moving air bilaterally. No wheeze or crackles. No use of accessory muscles for breathing.   HEART: S1 and S2 regular rate and rhythm. No murmurs, rubs, or gallops.   ABDOMEN: Soft, nontender, and nondistended. No hepatosplenomegaly. Normal bowel sounds. Suprapubic catheter is in place.   EXTREMITIES: No pedal edema. No clubbing or cyanosis. Tender to touch in right upper and lower extremities after her stroke. Left hand medial four digits are amputated. There is functioning AV fistula  in the left upper arm.  SKIN: No acne, rash, or lesions.   NEUROLOGIC: Difficult to assess secondary to the patient's mental status. She is still able to move her left upper and lower extremities and paralyzed on the right side. Having startle response to voice but not opening eyes and not communicating. At baseline has slurred speech secondary to prior stroke and well understood only by family. She is wheelchair bound.   LABS/STUDIES: WBC 10.5, hemoglobin 11.0, hematocrit 33.0, and platelet count 124.   Sodium 137, potassium 3.9, chloride 98, bicarbonate 28, BUN 52, creatinine 7.95, glucose 193, and calcium 8.2.   ABG is showing pH of 7.43, pCO2 43, pO2 49, and bicarbonate 28.5. Saturation is 85% on room air. Lactic acid is slightly elevated at 1.9.  Chest x-ray is showing possible infiltrate in the right base medially.  EKG is pending at this time.   ASSESSMENT AND PLAN: This is a 59 year old female with history of CVA with right-sided weakness, wheelchair bound at baseline, insulin-dependent  diabetes mellitus, and endstage renal disease on hemodialysis who is visiting family and is from [place] and brought in for sepsis.  1. Septic shock: Likely source is pneumonia. So far urinary tract infection can also be a possibility, however, urinalysis is pending. Of note, the patient is a dialysis patient but still makes urine and has chronic suprapubic catheter from her prior stroke. Blood and urine cultures have been sent. The patient appears very sick so we will start with broad-spectrum antibiotics with vancomycin and Levaquin, renally dosed. Infectious Disease consult as per husband's request because of her prior infections requiring different antibiotics in the past. Her records from [place] have been requested. Gentle hydration as the patient clinically appears dry. We will not overload especially as she is a dialysis patient. Please contact the patient's case manager, Ms. Augusto Gamble, at [phone] in [place] regarding possible discharge plan as they need to arrange for the patient to fly back to [place] later.  2. Endstage renal disease on hemodialysis Tuesday, Thursday, and Saturday dialysis: She has missed her dialysis today but does not appear clinically fluid overloaded and potassium looks okay, so no urgent needs at this time. We will get nephrology consult. Continue  Renvela if able to take p.o. medications.  3. Cerebrovascular accident: Continue her aspirin. Known right-sided weakness. 4. Insulin-dependent diabetes mellitus: We will keep her n.p.o. because of her altered mental status, except medications. Hold glipizide and insulin until she starts eating. Sliding scale insulin coverage for now.  5. Peripheral neuropathy: Hold medications because that can cause more altered mental status or more drowsiness at this time. Please start medications as needed once mental status improves. 6. GI and DVT prophylaxis: IV Protonix and TEDS.           CODE STATUS: FULL CODE for now. I  discussed with her husband at bedside. If she requires prolonged intubation then he would not want that.   TOTAL CRITICAL CARE TIME SPENT: 60 minutes.  ____________________________ Enid Baas, MD rk:slb D: 01/07/2012 16:17:55 ET T: 01/07/2012 17:00:03 ET JOB#: [id number]  cc: Enid Baas, MD, <Dictator> Enid Baas MD ELECTRONICALLY SIGNED 01/14/2012 14:26

## 2015-01-01 NOTE — Consult Note (Signed)
PATIENT NAME:  Terri Robinson, Nicey MR#:  [id number] DATE OF BIRTH:  1956-05-03  DATE OF CONSULTATION:  01/08/2012  REFERRING PHYSICIAN:  Enid Baasadhika Kalisetti, MD CONSULTING PHYSICIAN:  Graycie Halley Lizabeth LeydenN. Tarea Skillman, MD  REASON FOR CONSULTATION: End-stage renal disease on hemodialysis.   HISTORY OF PRESENT ILLNESS: The patient is a 59 year old female with past medical history of end-stage renal disease, on hemodialysis Tuesday, Thursday, and Saturday for eight years, diabetes mellitus, cerebrovascular accident with residual right-sided weakness, blindness in the left eye, glaucoma, seizure disorder, anemia of chronic kidney disease, secondary hyperparathyroidism, suprapubic catheter placement, who presented to [facility] yesterday with altered mental status and fever. The patient is visiting from [place], [place]. She is here to visit her son. She had been dialyzing at [facility] on [address]. She started dialysis there on 12/26/2011. She developed altered mental status and fever and was brought here for evaluation. Yesterday she had a temperature of 101.5. She has a left upper extremity AV fistula in place that does not appear to be erythematous or warm. She has a suprapubic catheter in place which is foul smelling, and there is significant purulent drainage noted from it. There was increased density in the right hilar region suspicious for infiltrate. She is currently on broad-spectrum antibiotics with vancomycin, ertapenem, and levofloxacin. Infectious Disease consultation is currently pending. The patient's husband states that her left upper extremity AV access has been used without difficulty. She uses button holes for this. She also has secondary hyperparathyroidism for which she takes Renvela powder at home. She also has anemia of chronic kidney disease, and most recent hemoglobin is 10.0. She is due for dialysis today as she missed yesterday.   PAST MEDICAL HISTORY:  1. End-stage renal  disease, on hemodialysis Tuesday, Thursday, Saturday x8 years, followed in [place].  2. Insulin-dependent diabetes mellitus.  3. Cerebrovascular accident with residual right-sided weakness.  4. Hypertension.  5. Blindness in the left eye.  6. Glaucoma.  7. History of seizure disorder.  8. Anemia of chronic kidney disease.  9. Secondary hyperparathyroidism.  10. Cholecystectomy.  11. Partial left hand amputation.  12. Left upper extremity A-V fistula placement.  13. Suprapubic catheter placement.  14. Depression. 15. Hypothyroidism.  ALLERGIES: No known drug allergies.   CURRENT INPATIENT MEDICATIONS:  1. Tylenol suppository 650 mg every 6 hours p.r.n.  2. Aspirin 81 mg daily.  3. Cosopt 1 drop in both eyes b.i.d.  4. Ertapenem 0.5 grams IV every 24 hours.  5. Sliding scale insulin.  6. Levothyroxine 0.075 mg at 6 a.m.  7. Zofran 4 mg IV every 4 hours p.r.n.  8. Protonix 40 mg IV every a.m.  9. Prednisolone acetate 1% ophthalmic drops, 1 drop in the right eye every 12 hours.  10. Renvela 800 mg t.i.d.  11. Vancomycin 750 mg IV x1.  12. Norco 7.5/325, 1 tablet p.o. every 6 hours p.r.n.  13. Neurontin 300 mg p.o. at bedtime.  14. Megace 40 mg daily.  15. Phenytoin 200 mg every 12 hours.  16. Seroquel 100 mg at bedtime.  17. Zoloft 50 mg daily.  18. Levofloxacin 250 mg IV every 48 hours.   SOCIAL HISTORY: She lives in [place]. She is married. She has four adult children. She quit smoking nine years ago. She has alcohol very seldom. No illicit drug use.   FAMILY HISTORY: Mother died secondary to congestive heart failure. Father died secondary to complications from diabetes mellitus. A sister also had a history of diabetes mellitus and endstage  renal disease.   REVIEW OF SYSTEMS: CONSTITUTIONAL: The patient has been having fevers and chills as well as malaise. EYES: Has legal blindness in the left eye as well as glaucoma. HEENT: Denies headaches, hearing loss, tinnitus,  epistaxis, sore throat. PULMONARY: Denies cough, shortness of breath, hemoptysis. CARDIOVASCULAR: Denies chest pain, palpitations, PND, orthopnea. GI: Has been having some mild nausea but no vomiting. Poor appetite currently. GU: Has suprapubic catheter in place and has suprapubic pain and also has purulent drainage from her suprapubic catheter. NEUROLOGIC: Has residual right-sided weakness from a prior cerebrovascular accident. MUSCULOSKELETAL: Denies joint pain, swelling, or redness. INTEGUMENTARY: Denies skin rashes or lesions. ENDOCRINE: Denies polyuria, polydipsia, or polyphagia. HEMATOLOGIC/LYMPHATIC: Denies easy bruisability, bleeding. Has history of anemia of chronic kidney disease. PSYCHIATRIC: Has depression. ALLERGY/IMMUNOLOGIC: Denies allergies or history of immunodeficiency.   PHYSICAL EXAMINATION:  VITAL SIGNS: Temperature max over the past 24 hours was 101.1, current temperature 99.6, pulse 99, respirations 20, blood pressure 116/64, pulse oximetry 96% on 2 liters.   GENERAL: Examination reveals a chronically ill-appearing female who appears to be in mild discomfort from suprapubic pain.   HEENT: Normocephalic, atraumatic. Extraocular movements are intact. Pupils are equal, round, and reactive to light. No scleral icterus. Conjunctivae are pink. No epistaxis noted. Gross hearing intact. Oral mucosa moist.   NECK: Supple without JVD or lymphadenopathy.   LUNGS: Clear to auscultation bilaterally with normal respiratory effort.   HEART: S1, S2 regular rate and rhythm. No audible murmurs or rubs.   ABDOMEN: Soft, nontender, nondistended. Bowel sounds are positive. No rebound or guarding. No gross organomegaly appreciated.   EXTREMITIES: No lower extremity edema noted. No clubbing or cyanosis noted.   NEUROLOGICAL: The patient is awake and alert. She has significant residual right-sided weakness. She has a mitt on her left hand.   MUSCULOSKELETAL: No joint redness, swelling or  tenderness appreciated.   SKIN: Warm and dry. No rashes noted.   GENITOURINARY: Suprapubic catheter noted to be in place. There is suprapubic tenderness at the insertion site of the suprapubic catheter. There is significant purulent drainage, and the drainage is quite foul smelling.   PSYCHIATRIC: The patient appears a bit anxious as she is in pain at present. She does appear to have some insight into her current illness.   LABORATORY, DIAGNOSTIC AND RADIOLOGICAL DATA:  CBC shows WBC 9.6, hemoglobin 10, hematocrit 30, platelets 136, MCV 107.  BMP shows sodium 138, potassium 4.1, chloride 98, CO2 26, BUN 60, creatinine 8.9, glucose 113, magnesium 3.2.  Hemoglobin A1c is 6.2.  Lipid panel shows cholesterol 79, LDL 7, HDL 33.  Troponin less than 0.02.  Urinalysis shows urine that is turbid. Urine protein is greater than 500 mg/dL, 2+ leukocyte esterase, 205 RBCs per high-power field, 325 WBC per high-power field, 3+ bacteria.  CT head: Large old left anterior cerebral artery distribution infarct.  Chest x-ray shows stable-appearing chest as compared to the exam of the earlier date. There was a right hilar density. ABGs:  pH 7.43, pCO2 43, pO2 49. Lactic acid 1.9.  Blood cultures x2 sets are negative thus far.  TSH 0.973.   IMPRESSION: The patient is a 59 year old female with past medical history of end-stage renal disease, on hemodialysis Tuesday, Thursday and Saturday, followed in [place] x8 years, diabetes mellitus, cerebrovascular accident with residual right-sided weakness, blindness in the left eye, glaucoma, anemia of chronic kidney disease, secondary hyperparathyroidism, seizure disorder, suprapubic catheter placement, left upper extremity AV fistula, who is visiting her son and developed  altered mental status and fever at home. The patient now is with suprapubic pain and foul-smelling drainage from her suprapubic catheter.   1. End-stage renal disease, on hemodialysis Tuesday,  Thursday, Saturday: The patient missed her regularly scheduled hemodialysis therapy yesterday. Therefore, she is in need of dialysis at present. She does not appear to be in volume overload at this point in time. We plan to start the patient on dialysis today using her left upper extremity AV fistula. We will attempt to use her button holes if possible. We will resume her regularly scheduled dialysis tomorrow.  2. Anemia of chronic kidney disease: The patient's hemoglobin is currently noted as being 10. We will start the patient on Epogen 10,000 units IV with hemodialysis and continue to monitor her CBC.  3. Secondary hyperparathyroidism: The patient was on Renvela powder at home. She is currently under Renvela 800 mg p.o. t.i.d. here. We will check intact PTH, phosphorus.  4. Severe urinary tract infection: She has a suprapubic catheter in place, and currently there is foul-smelling drainage from the suprapubic catheter site. We will obtain CT scan of the abdomen and pelvis without contrast for further evaluation. We will also request a Urology consultation given the fact that this is her potential source of infection at this point in time. The patient will be continued on broad-spectrum antibiotics at this time, and I will not adjust these as currently an Infectious Disease consultation with Dr. Leavy Cella is pending.   I would like to thank Dr. Nemiah Commander for this kind referral. Further plan as the patient progresses.   ____________________________ Lennox Pippins, MD [email or web address] D: 01/08/2012 10:04:03 ET T: 01/08/2012 10:42:59 ET JOB#: [id number]  cc: Lennox Pippins, MD, <Dictator> Lennox Pippins MD ELECTRONICALLY SIGNED 02/13/2012 14:52

## 2015-01-01 NOTE — Consult Note (Signed)
Brief Consult Note: Diagnosis: ESRD. Suprapubic tube.   Patient was seen by consultant.   Consult note dictated.   Recommend to proceed with surgery or procedure.   Orders entered.   Discussed with Attending MD.   Comments: Exchange SP tube and follow-up with her regular urologist.  Electronic Signatures: Orson ApeWolff, Trejon Duford R (MD)  (Signed 02-May-13 14:48)  Authored: Brief Consult Note   Last Updated: 02-May-13 14:48 by Orson ApeWolff, Bonni Neuser R (MD)

## 2015-01-01 NOTE — Consult Note (Signed)
PATIENT NAME:  Terri Robinson, Terri Robinson MR#:  [id number] DATE OF BIRTH:  [birth year]  DATE OF CONSULTATION:  01/09/2012  REFERRING PHYSICIAN:  Enid Baasadhika Kalisetti, MD CONSULTING PHYSICIAN:  Suszanne ConnersMichael R. Evelene CroonWolff, MD  REASON FOR CONSULTATION: Suprapubic tube.   HISTORY OF PRESENT ILLNESS: Terri Robinson is a 59 year old female admitted to the hospital with altered mental status and fever. Source of her fever has yet to be determined. She has had a suprapubic tube in place since November primarily for incontinence. The catheter is changed by a home health nurse every three weeks and her husband provides daily care. She is on dialysis and her husband states that she puts out about 20 mL of urine per suprapubic tube in a 24 hour period. She is followed by a urologist at [facility] in [place] and is here visiting family.   ALLERGIES: No drug allergies.   CHRONIC HOME MEDICATIONS: Percocet, aspirin, Atrovent, Colace, Dorzolamide and Timolol eyedrops, gabapentin, glipizide, Humulin insulin, levothyroxine, Megace, Prilosec, Dilantin, prednisolone, Pro Air, quetiapine, Renvela, sertraline and zolpidem.   PAST AND CURRENT MEDICAL CONDITIONS: 1. End-stage renal disease.  2. Insulin-dependent diabetes.  3. History of stroke with right hemiparesis. 4. Left eye blindness.  5. Glaucoma. 6. Seizure disorder.   PAST SURGICAL HISTORY:  1. Cholecystectomy.  2. Amputation of left hand. 3. Left arm AV fistula.   PHYSICAL EXAMINATION: Suprapubic tube appeared to be in good position.   IMPRESSION:  1. End-stage renal disease. 2. History of suprapubic tube placement.   PLAN:  1. Change the suprapubic tube as it was due for a change last week. 2. Followup with your regular urologist in [place].  3. Consider removal of the tube when she returns to [place] because of minimal urine output. ____________________________ Suszanne ConnersMichael R. Evelene CroonWolff, MD [id number] D: 01/09/2012 14:47:41 ET T: 01/09/2012 17:52:52  ET JOB#: [id number]  cc: Suszanne ConnersMichael R. Evelene CroonWolff, MD, <Dictator> Orson ApeMICHAEL R WOLFF MD ELECTRONICALLY SIGNED 01/10/2012 11:12

## 2015-08-30 IMAGING — CR DG CHEST 1V PORT
1 series · 1 of 1 positions shown · non-contrast
Comparison: DG CHEST 1V PORT dated 01/11/2012

CLINICAL DATA: Shortness of breath

EXAM:
PORTABLE CHEST - 1 VIEW

[ap]
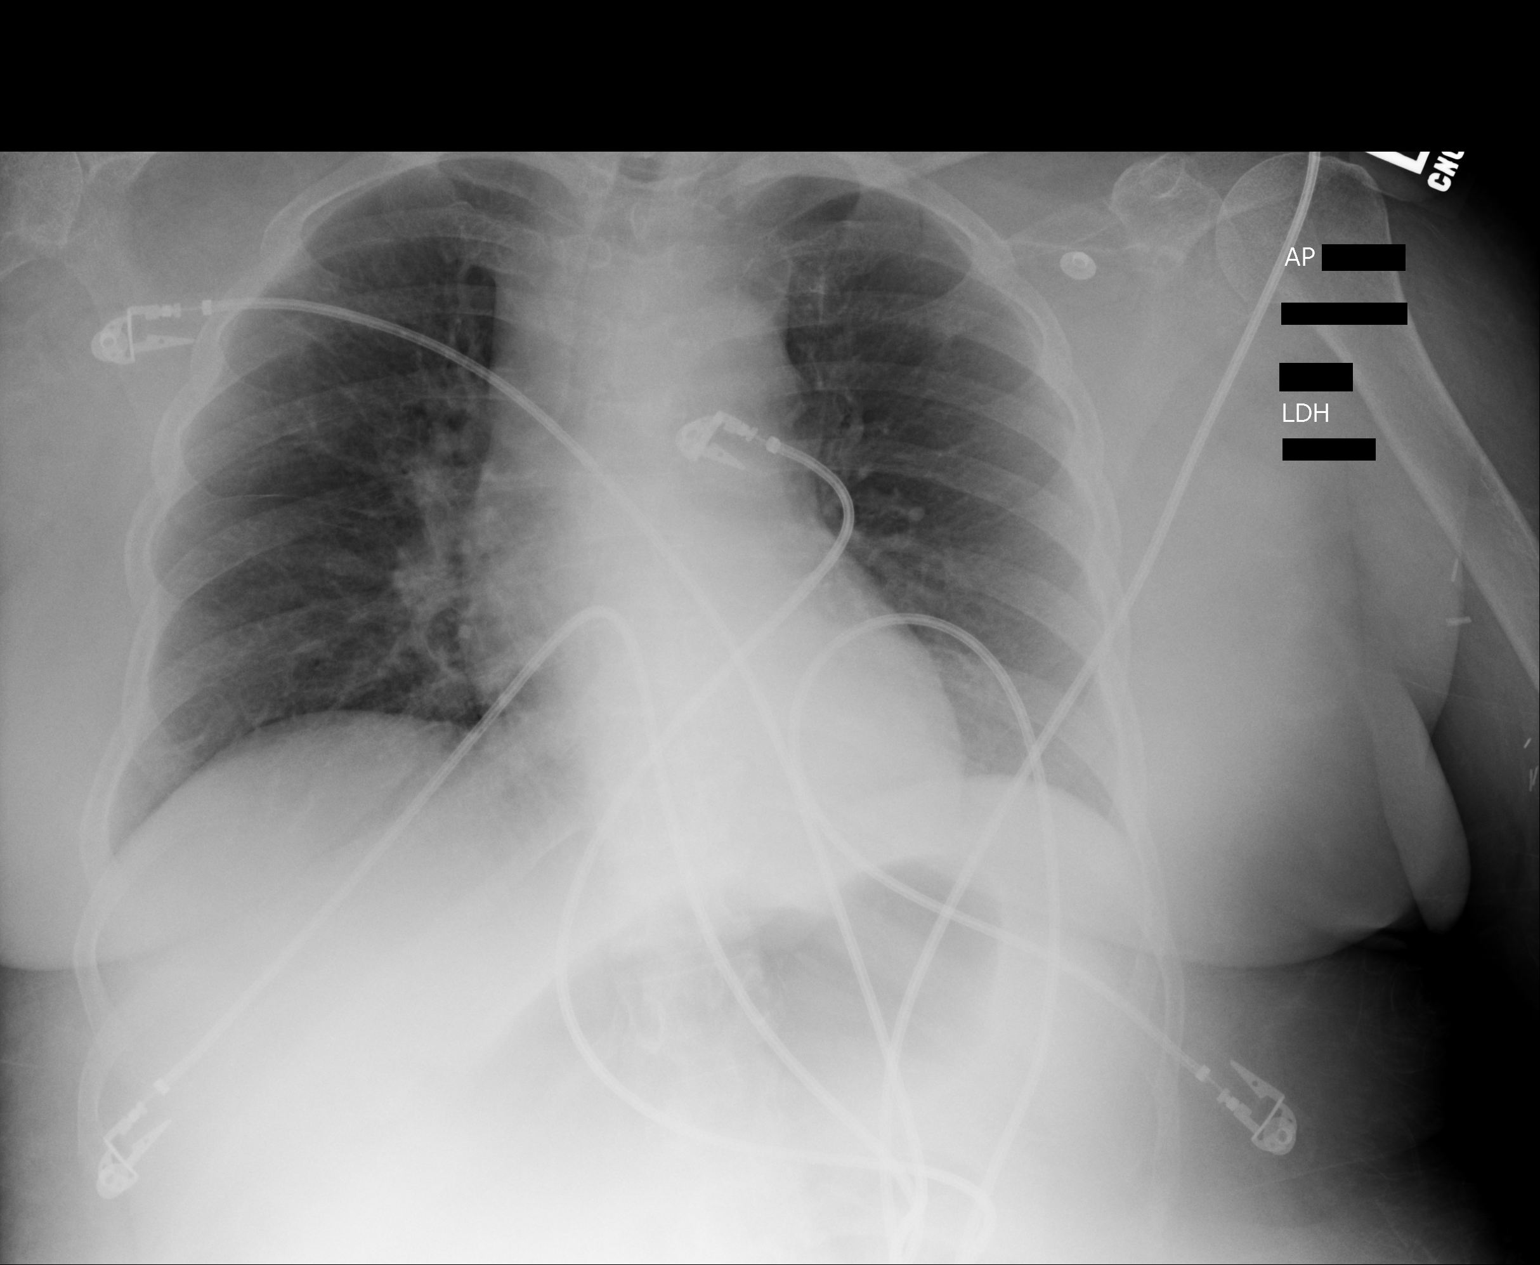

[1 of 1 positions shown; findings below may reference images not displayed]

FINDINGS: The heart size and mediastinal contours are within normal limits.
Both lungs are clear. The visualized skeletal structures are
unremarkable.
IMPRESSION: No active disease.
